# Patient Record
Sex: Female | Born: 1955 | Race: White | Hispanic: No | Marital: Married | Smoking: Current every day smoker
Health system: Southern US, Community
[De-identification: ages and names within clinical notes are randomized; demographics above are authoritative.]

## PROBLEM LIST (undated history)

## (undated) DIAGNOSIS — Z789 Other specified health status: Secondary | ICD-10-CM

## (undated) DIAGNOSIS — M329 Systemic lupus erythematosus, unspecified: Secondary | ICD-10-CM

## (undated) DIAGNOSIS — F419 Anxiety disorder, unspecified: Secondary | ICD-10-CM

## (undated) DIAGNOSIS — F32A Depression, unspecified: Secondary | ICD-10-CM

## (undated) DIAGNOSIS — B1920 Unspecified viral hepatitis C without hepatic coma: Secondary | ICD-10-CM

## (undated) HISTORY — PX: KNEE SURGERY: SHX244

## (undated) HISTORY — DX: Unspecified viral hepatitis C without hepatic coma: B19.20

## (undated) HISTORY — PX: BREAST SURGERY: SHX581

## (undated) HISTORY — DX: Anxiety disorder, unspecified: F41.9

## (undated) HISTORY — DX: Systemic lupus erythematosus, unspecified: M32.9

## (undated) HISTORY — DX: Depression, unspecified: F32.A

## (undated) HISTORY — PX: HAND SURGERY: SHX662

## (undated) HISTORY — DX: Other specified health status: Z78.9

## (undated) HISTORY — PX: DILATION AND CURETTAGE OF UTERUS: SHX78

## (undated) HISTORY — PX: TUBAL LIGATION: SHX77

## (undated) HISTORY — PX: FOOT SURGERY: SHX648

---

## 2010-10-20 ENCOUNTER — Other Ambulatory Visit (HOSPITAL_COMMUNITY): Payer: Self-pay | Admitting: Internal Medicine

## 2010-10-20 DIAGNOSIS — Z139 Encounter for screening, unspecified: Secondary | ICD-10-CM

## 2010-10-27 ENCOUNTER — Inpatient Hospital Stay (HOSPITAL_COMMUNITY): Admission: RE | Admit: 2010-10-27 | Payer: Self-pay | Source: Ambulatory Visit

## 2010-12-09 ENCOUNTER — Other Ambulatory Visit: Payer: Self-pay

## 2010-12-09 ENCOUNTER — Encounter: Payer: Self-pay | Admitting: General Practice

## 2010-12-09 DIAGNOSIS — Z139 Encounter for screening, unspecified: Secondary | ICD-10-CM

## 2010-12-11 ENCOUNTER — Encounter: Payer: Self-pay | Admitting: Gastroenterology

## 2010-12-11 ENCOUNTER — Ambulatory Visit (INDEPENDENT_AMBULATORY_CARE_PROVIDER_SITE_OTHER): Payer: BC Managed Care – PPO | Admitting: Gastroenterology

## 2010-12-11 DIAGNOSIS — Z1211 Encounter for screening for malignant neoplasm of colon: Secondary | ICD-10-CM | POA: Insufficient documentation

## 2010-12-11 DIAGNOSIS — K649 Unspecified hemorrhoids: Secondary | ICD-10-CM | POA: Insufficient documentation

## 2010-12-11 MED ORDER — LIDOCAINE-HYDROCORTISONE ACE 3-1 % RE KIT: 1.0000 "application " | PACK | Freq: Two times a day (BID) | RECTAL | Status: DC

## 2010-12-11 NOTE — Progress Notes (Signed)
Cc to PCP 

## 2010-12-11 NOTE — Assessment & Plan Note (Signed)
Colonoscopy in the near future.  I have discussed the risks, alternatives, benefits with regards to but not limited to the risk of reaction to medication, bleeding, infection, perforation and the patient is agreeable to proceed. Written consent to be obtained.  

## 2010-12-11 NOTE — Patient Instructions (Signed)
I sent RX for hemorrhoids to [facility] in [place].  See separate instructions for your scheduled colonoscopy.

## 2010-12-11 NOTE — Progress Notes (Signed)
Primary Care Physician:  Colette Ribas, MD  Primary Gastroenterologist:  Roetta Sessions, MD   Chief Complaint  Patient presents with  . Colonoscopy    HPI:  Ebony Salinas is a 55 y.o. female here to schedule colonoscopy. She has problems with hemorrhoids as well. intermittent constipation. Worse when she travels. Bowel movement most days. Hemorrhoid issues off/on for years. Some protrusion with itching/burning intermittently. No rectal bleeding or melena. No prior colonoscopy. Has put off having a colonoscopy because her aunt had perforation of the bowel. No heartburn, vomiting, dysphagia, abdominal pain.   Current Outpatient Prescriptions  Medication Sig Dispense Refill  . diazepam (VALIUM) 10 MG tablet Take 10 mg by mouth every 6 (six) hours as needed.        . naproxen sodium (ANAPROX) 220 MG tablet Take 220 mg by mouth 2 (two) times daily with a meal.        . lidocaine-hydrocortisone (ANAMANTLE HC FORTE) 3-1 % KIT Place 1 application rectally 2 (two) times daily.  28 each  0    Allergies as of 12/11/2010  . (No Known Allergies)    Past Medical History  Diagnosis Date  . No pertinent past medical history     Past Surgical History  Procedure Date  . Dilation and curettage of uterus   . Hand surgery     left x2  . Foot surgery     left  . Knee surgery     both  . Tubal ligation   . Breast surgery     x3    Family History  Problem Relation Age of Onset  . Colon cancer Neg Hx   . Colon polyps Neg Hx   . Liver disease Neg Hx     History   Social History  . Marital Status: Married    Spouse Name: N/A    Number of Children: 2  . Years of Education: N/A   Occupational History  .  [employer]   Social History Main Topics  . Smoking status: Current Everyday Smoker -- 0.5 packs/day    Types: Cigarettes  . Smokeless tobacco: Not on file   Comment: 1/2ppd  . Alcohol Use: Yes     4-6 beers a day (12 ounce beers)  . Drug Use: No  . Sexually Active:  Not on file   Other Topics Concern  . Not on file   Social History Narrative  . No narrative on file      ROS:  General: Negative for anorexia, weight loss, fever, chills, fatigue, weakness. Eyes: Negative for vision changes.  ENT: Negative for hoarseness, difficulty swallowing , nasal congestion. CV: Negative for chest pain, angina, palpitations, dyspnea on exertion, peripheral edema.  Respiratory: Negative for dyspnea at rest, dyspnea on exertion, cough, sputum, wheezing.  GI: See history of present illness. GU:  Negative for dysuria, hematuria, urinary incontinence, urinary frequency, nocturnal urination.  MS: Negative for joint pain, low back pain.  Derm: Negative for rash or itching.  Neuro: Negative for weakness, abnormal sensation, seizure, frequent headaches, memory loss, confusion.  Psych: Negative for anxiety, depression, suicidal ideation, hallucinations.  Endo: Negative for unusual weight change.  Heme: Negative for bruising or bleeding. Allergy: Negative for rash or hives.    Physical Examination:  BP 131/86  Pulse 76  Temp(Src) 97.5 F (36.4 C) (Temporal)  Ht 5\' 5"  (1.651 m)  Wt 113 lb 6.4 oz (51.438 kg)  BMI 18.87 kg/m2   General: Well-nourished, well-developed in no  acute distress.  Head: Normocephalic, atraumatic.   Eyes: Conjunctiva pink, no icterus. Mouth: Oropharyngeal mucosa moist and pink , no lesions erythema or exudate. Neck: Supple without thyromegaly, masses, or lymphadenopathy.  Lungs: Clear to auscultation bilaterally.  Heart: Regular rate and rhythm, no murmurs rubs or gallops.  Abdomen: Bowel sounds are normal, nontender, nondistended, no hepatosplenomegaly or masses, no abdominal bruits or    hernia , no rebound or guarding.   Rectal: defer to time of colonoscopy. Extremities: No lower extremity edema. No clubbing or deformities.  Neuro: Alert and oriented x 4 , grossly normal neurologically.  Skin: Warm and dry, no rash or jaundice.     Psych: Alert and cooperative, normal mood and affect.

## 2010-12-11 NOTE — Assessment & Plan Note (Signed)
Anamantle forte apply anorectally bid for two weeks.

## 2010-12-15 ENCOUNTER — Telehealth: Payer: Self-pay

## 2010-12-15 NOTE — Telephone Encounter (Signed)
LMOM that time has changed to 11:00 to be at Short Stay for colonoscopy on 01/02/2011. Asked pt to call back and confirm she received this message.

## 2010-12-16 NOTE — Progress Notes (Signed)
REVIEWED.  

## 2010-12-22 NOTE — Telephone Encounter (Signed)
LMOM again at home of the time change to be at Short Stay at 11:00 AM on 01/02/2011. Mailing a letter also, with the time change.

## 2010-12-25 ENCOUNTER — Encounter (HOSPITAL_COMMUNITY): Payer: Self-pay | Admitting: Pharmacy Technician

## 2011-01-01 MED ORDER — SODIUM CHLORIDE 0.45 % IV SOLN: Freq: Once | INTRAVENOUS | Status: DC

## 2011-01-02 ENCOUNTER — Ambulatory Visit (HOSPITAL_COMMUNITY)
Admission: RE | Admit: 2011-01-02 | Payer: BC Managed Care – PPO | Source: Ambulatory Visit | Admitting: Internal Medicine

## 2011-01-02 ENCOUNTER — Encounter (HOSPITAL_COMMUNITY): Admission: RE | Payer: Self-pay | Source: Ambulatory Visit

## 2011-01-02 SURGERY — COLONOSCOPY
Anesthesia: Moderate Sedation

## 2020-04-02 ENCOUNTER — Encounter: Payer: Self-pay | Admitting: Internal Medicine

## 2020-04-18 ENCOUNTER — Ambulatory Visit (INDEPENDENT_AMBULATORY_CARE_PROVIDER_SITE_OTHER): Payer: BC Managed Care – PPO | Admitting: Nurse Practitioner

## 2020-04-18 ENCOUNTER — Other Ambulatory Visit: Payer: Self-pay

## 2020-04-18 ENCOUNTER — Encounter: Payer: Self-pay | Admitting: Nurse Practitioner

## 2020-04-18 DIAGNOSIS — B1920 Unspecified viral hepatitis C without hepatic coma: Secondary | ICD-10-CM | POA: Insufficient documentation

## 2020-04-18 NOTE — Progress Notes (Signed)
Primary Care Physician:  Sharilyn Sites, MD Primary Gastroenterologist:  Dr. Gala Romney  Chief Complaint  Patient presents with  . Hepatitis C    Newly diagnosed. Was drinking 4 beers per day and now drinks 1 beer per day and few glasses of wine    HPI:   Ebony Salinas is a 65 y.o. female who presents on referral from primary care for newly diagnosed positive hepatitis C.  Reviewed information provided with referral including office visit note dated 03/28/2020.  This is a visit to establish care.  Patient was identified as positive hepatitis C and referred to GI.  Reviewed included labs including protein 13.7/normal, platelets normal at 353, hepatitis C RNA positive, LFTs demonstrate elevated alkaline phosphatase mildly elevated at 126, but bilirubin, AST/ALT normal.  Primary care also indicated that counseled patient on alcohol cessation/avoidance for evaluation of hepatitis C is ongoing.  He also recommended an acute hepatitis panel check for hepatitis A/B serologies.  However, these were not included with the referral information.  Today she states she doing okay overall. She states she's never been told she has Hepatitis C before. Denies abdominal pain, N/V. She did a "poop in the box" think and it came back positive so she needs a colonoscopy. Denies melena, fever, chills. She denies unintentional weight loss. Denies URI or flu-like symptoms. Denies loss of sense of taste or smell. The patient has not received COVID-19 vaccination(s). Denies chest pain, dyspnea, dizziness, lightheadedness, syncope, near syncope. Denies any other upper or lower GI symptoms.  She states she has previously told she has Hepatitis B antibodies.  Hepatitis C Risk Factors: Birth cohort ([place]): Yes IV drug use: Yes, in her 57's Tattoos: Yes (4) at reputable parlor with sterile technique Blood product transfusion: No HC worker: No Hemodialysis: No Maternal infection: No  Past Medical History:   Diagnosis Date  . No pertinent past medical history     Past Surgical History:  Procedure Laterality Date  . BREAST SURGERY     x3  . DILATION AND CURETTAGE OF UTERUS    . FOOT SURGERY     left  . HAND SURGERY     left x2  . KNEE SURGERY     both  . TUBAL LIGATION      Current Outpatient Medications  Medication Sig Dispense Refill  . Cholecalciferol (VITAMIN D) 50 MCG (2000 UT) CAPS Take by mouth. daily    . Glucosamine 750 MG TABS Take by mouth. 2 daily    . hydroxychloroquine (PLAQUENIL) 200 MG tablet Take 200 mg by mouth daily.    . Omega-3 Fatty Acids (OMEGA 3 PO) Take by mouth. 2 daily    . sertraline (ZOLOFT) 25 MG tablet Take 25 mg by mouth daily.    . diazepam (VALIUM) 10 MG tablet Take 10 mg by mouth every 6 (six) hours as needed.   (Patient not taking: Reported on 04/18/2020)    . hydrocodone-acetaminophen (LORCET-HD) 5-500 MG per capsule Take 1 capsule by mouth every 4 (four) hours as needed. Patient broke her arm last Friday and was prescribed these, so she is not taking Valium or Naproxen with this med at this time  (Patient not taking: Reported on 04/18/2020)    . lidocaine-hydrocortisone (ANAMANTLE HC FORTE) 3-1 % KIT Place 1 application rectally 2 (two) times daily. (Patient not taking: Reported on 04/18/2020) 28 each 0  . naproxen sodium (ANAPROX) 220 MG tablet Take 220 mg by mouth 2 (two) times daily  with a meal.   (Patient not taking: Reported on 04/18/2020)     No current facility-administered medications for this visit.    Allergies as of 04/18/2020  . (No Known Allergies)    Family History  Problem Relation Age of Onset  . Colon cancer Neg Hx   . Colon polyps Neg Hx   . Liver disease Neg Hx     Social History   Socioeconomic History  . Marital status: Married    Spouse name: Not on file  . Number of children: 2  . Years of education: Not on file  . Highest education level: Not on file  Occupational History    Employer: [employer]   Tobacco Use  . Smoking status: Current Every Day Smoker    Packs/day: 0.50    Types: Cigarettes  . Smokeless tobacco: Never Used  . Tobacco comment: 1/2ppd  Substance and Sexual Activity  . Alcohol use: Yes  . Drug use: No  . Sexual activity: Not on file  Other Topics Concern  . Not on file  Social History Narrative  . Not on file   Social Determinants of Health   Financial Resource Strain: Not on file  Food Insecurity: Not on file  Transportation Needs: Not on file  Physical Activity: Not on file  Stress: Not on file  Social Connections: Not on file  Intimate Partner Violence: Not on file    Subjective: Review of Systems  Constitutional: Negative for chills, fever, malaise/fatigue and weight loss.  HENT: Negative for congestion and sore throat.   Respiratory: Negative for cough and shortness of breath.   Cardiovascular: Negative for chest pain and palpitations.  Gastrointestinal: Positive for blood in stool. Negative for abdominal pain, diarrhea, heartburn, melena, nausea and vomiting.  Musculoskeletal: Positive for joint pain. Negative for myalgias.  Skin: Negative for rash.  Neurological: Negative for dizziness and weakness.  Endo/Heme/Allergies: Does not bruise/bleed easily.  Psychiatric/Behavioral: Negative for depression. The patient is not nervous/anxious.   All other systems reviewed and are negative.      Objective: BP (!) 136/95   Pulse 77   Temp (!) 96.9 F (36.1 C)   Ht 5' 5"  (1.651 m)   Wt 114 lb 9.6 oz (52 kg)   BMI 19.07 kg/m  Physical Exam Vitals and nursing note reviewed.  Constitutional:      General: She is not in acute distress.    Appearance: Normal appearance. She is well-developed and normal weight. She is not ill-appearing, toxic-appearing or diaphoretic.  HENT:     Head: Normocephalic and atraumatic.     Nose: No congestion or rhinorrhea.  Eyes:     General: No scleral icterus. Cardiovascular:     Rate and Rhythm: Normal rate  and regular rhythm.     Heart sounds: Normal heart sounds.  Pulmonary:     Effort: Pulmonary effort is normal. No respiratory distress.     Breath sounds: Normal breath sounds.  Abdominal:     General: Bowel sounds are normal.     Palpations: Abdomen is soft. There is no hepatomegaly, splenomegaly or mass.     Tenderness: There is no abdominal tenderness. There is no guarding or rebound.     Hernia: No hernia is present.  Skin:    General: Skin is warm and dry.     Coloration: Skin is not jaundiced.     Findings: No rash.  Neurological:     General: No focal deficit present.  Mental Status: She is alert and oriented to person, place, and time.  Psychiatric:        Attention and Perception: Attention normal.        Mood and Affect: Mood normal.        Speech: Speech normal.        Behavior: Behavior normal.        Thought Content: Thought content normal.        Cognition and Memory: Cognition and memory normal.      Assessment:  Very pleasant 65 year old female presents on referral from primary care for positive hepatitis C RNA.  An extensive discussion with education on hepatitis C, hepatitis viruses, high risk behaviors, treatment possibilities with the patient.  High risk behaviors as outlined in HPI including history of IV drug use (none in 4 years), tattoos, birth cohort.  Initial labs including normal LFTs and normal platelet count suggests no significant liver damage.  We will plan for an extensive work-up for hepatitis a and B serologies, HIV, liver parenchymal evaluation with ultrasound elastography.  I will try to get the prior authorization process started for likely Epclusa treatment.  Further recommendations will follow her lab results   Plan: 1. CBC, CMP, hepatitis A antibody, hepatitis B core antigen, hepatitis B surface antigen, hepatitis B core antibody, hepatitis C genotype, HIV antibody with reflex to Western blot 2. Complete abdominal ultrasound with liver  elastography 3. Follow-up in 2 months for further hepatitis C treatment decisions and to schedule colonoscopy    Thank you for allowing [place] to participate in the care of McGregor, DNP, AGNP-C Adult & Gerontological Nurse Practitioner [facility]   04/18/2020 8:26 AM   Disclaimer: This note was dictated with voice recognition software. Similar sounding words can inadvertently be transcribed and may not be corrected upon review.

## 2020-04-18 NOTE — Patient Instructions (Signed)
Your health issues we discussed today were:   Hepatitis C: 1. Have your labs completed when you are able to 2. We will call you with results 3. We will help schedule your ultrasound your liver for you 4. Further recommendations will follow all your results 5. We can further discuss treatment options at your follow-up visit 6. Call us if you have any questions about hepatitis C  Overall I recommend:  1. Continue other current medications 2. Return for follow-up in 2 months related to hepatitis C and to schedule a colonoscopy for you 3. Call us if you have any questions or concerns   ---------------------------------------------------------------  At [facility] we value your feedback. You may receive a survey about your visit today. Please share your experience as we strive to create trusting relationships with our patients to provide genuine, compassionate, quality care.  We appreciate your understanding and patience as we review any laboratory studies, imaging, and other diagnostic tests that are ordered as we care for you. Our office policy is 5 business days for review of these results, and any emergent or urgent results are addressed in a timely manner for your best interest. If you do not hear from our office in 1 week, please contact us.   We also encourage the use of MyChart, which contains your medical information for your review as well. If you are not enrolled in this feature, an access code is on this after visit summary for your convenience. Thank you for allowing [place] to be involved in your care.  It was great to see you today!  I hope you have a great spring!!

## 2020-04-19 ENCOUNTER — Other Ambulatory Visit (HOSPITAL_COMMUNITY)
Admission: RE | Admit: 2020-04-19 | Discharge: 2020-04-19 | Disposition: A | Discharge status: Home / Self Care | Payer: BC Managed Care – PPO | Source: Ambulatory Visit | Attending: Nurse Practitioner | Admitting: Nurse Practitioner

## 2020-04-19 DIAGNOSIS — B1920 Unspecified viral hepatitis C without hepatic coma: Secondary | ICD-10-CM | POA: Diagnosis present

## 2020-04-19 LAB — CBC WITH DIFFERENTIAL/PLATELET
Abs Immature Granulocytes: 0.02 10*3/uL (ref 0.00–0.07)
Basophils Absolute: 0 10*3/uL (ref 0.0–0.1)
Basophils Relative: 1 %
Eosinophils Absolute: 0.1 10*3/uL (ref 0.0–0.5)
Eosinophils Relative: 1 %
HCT: 41.4 % (ref 36.0–46.0)
Hemoglobin: 13.7 g/dL (ref 12.0–15.0)
Immature Granulocytes: 0 %
Lymphocytes Relative: 30 %
Lymphs Abs: 2.3 10*3/uL (ref 0.7–4.0)
MCH: 33.1 pg (ref 26.0–34.0)
MCHC: 33.1 g/dL (ref 30.0–36.0)
MCV: 100 fL (ref 80.0–100.0)
Monocytes Absolute: 0.6 10*3/uL (ref 0.1–1.0)
Monocytes Relative: 8 %
Neutro Abs: 4.5 10*3/uL (ref 1.7–7.7)
Neutrophils Relative %: 60 %
Platelets: 65 10*3/uL — ABNORMAL LOW (ref 150–400)
RBC: 4.14 MIL/uL (ref 3.87–5.11)
RDW: 12.6 % (ref 11.5–15.5)
WBC: 7.5 10*3/uL (ref 4.0–10.5)
nRBC: 0 % (ref 0.0–0.2)

## 2020-04-19 LAB — HEPATIC FUNCTION PANEL
ALT: 20 U/L (ref 0–44)
AST: 29 U/L (ref 15–41)
Albumin: 4.4 g/dL (ref 3.5–5.0)
Alkaline Phosphatase: 108 U/L (ref 38–126)
Bilirubin, Direct: 0.1 mg/dL (ref 0.0–0.2)
Indirect Bilirubin: 0.5 mg/dL (ref 0.3–0.9)
Total Bilirubin: 0.6 mg/dL (ref 0.3–1.2)
Total Protein: 8.2 g/dL — ABNORMAL HIGH (ref 6.5–8.1)

## 2020-04-19 LAB — HIV ANTIBODY (ROUTINE TESTING W REFLEX): HIV Screen 4th Generation wRfx: NONREACTIVE

## 2020-04-19 LAB — HEPATITIS B CORE ANTIBODY, TOTAL: Hep B Core Total Ab: NONREACTIVE

## 2020-04-19 LAB — HEPATITIS B SURFACE ANTIGEN: Hepatitis B Surface Ag: NONREACTIVE

## 2020-04-19 LAB — HEPATITIS B SURFACE ANTIBODY,QUALITATIVE: Hep B S Ab: NONREACTIVE

## 2020-04-19 LAB — HEPATITIS A ANTIBODY, TOTAL: hep A Total Ab: NONREACTIVE

## 2020-04-23 LAB — HEPATITIS C GENOTYPE

## 2020-04-25 ENCOUNTER — Telehealth: Payer: Self-pay | Admitting: *Deleted

## 2020-04-25 NOTE — Telephone Encounter (Signed)
Order in [place] for [place] but we were not given encounter form to schedule.  [place] scheduled for 4/26 at 10:30am, arrival 10:15am, npo midnight  Called pt and she is aware of appt details. She voiced understanding and needed nothing further

## 2020-04-30 ENCOUNTER — Other Ambulatory Visit: Payer: Self-pay

## 2020-04-30 ENCOUNTER — Ambulatory Visit (HOSPITAL_COMMUNITY)
Admission: RE | Admit: 2020-04-30 | Discharge: 2020-04-30 | Disposition: A | Discharge status: Home / Self Care | Payer: BC Managed Care – PPO | Source: Ambulatory Visit | Attending: Nurse Practitioner | Admitting: Nurse Practitioner

## 2020-04-30 DIAGNOSIS — B1920 Unspecified viral hepatitis C without hepatic coma: Secondary | ICD-10-CM | POA: Diagnosis present

## 2020-05-07 ENCOUNTER — Encounter: Payer: Self-pay | Admitting: Internal Medicine

## 2020-05-08 ENCOUNTER — Telehealth: Payer: Self-pay | Admitting: Internal Medicine

## 2020-05-08 NOTE — Telephone Encounter (Signed)
See result note.  

## 2020-05-08 NOTE — Telephone Encounter (Signed)
Returning call.

## 2020-05-09 ENCOUNTER — Telehealth: Payer: Self-pay | Admitting: Nurse Practitioner

## 2020-05-09 DIAGNOSIS — B1920 Unspecified viral hepatitis C without hepatic coma: Secondary | ICD-10-CM

## 2020-05-09 NOTE — Telephone Encounter (Signed)
Please tell the patient one of the hepatitis C labs that was drawn only told us "yes/no" if hepatitis C is present.  We need the number of viral copies.  Please have her blood test drawn when she is able to empty completely final paperwork to submit to insurance for coverage of hepatitis C medication.

## 2020-05-09 NOTE — Telephone Encounter (Signed)
Noted. Called and informed pt.

## 2020-05-10 ENCOUNTER — Other Ambulatory Visit (HOSPITAL_COMMUNITY)
Admission: RE | Admit: 2020-05-10 | Discharge: 2020-05-10 | Disposition: A | Discharge status: Home / Self Care | Payer: BC Managed Care – PPO | Source: Ambulatory Visit | Attending: Nurse Practitioner | Admitting: Nurse Practitioner

## 2020-05-10 ENCOUNTER — Other Ambulatory Visit: Payer: Self-pay

## 2020-05-10 DIAGNOSIS — B1920 Unspecified viral hepatitis C without hepatic coma: Secondary | ICD-10-CM | POA: Insufficient documentation

## 2020-05-15 LAB — HCV RNA QUANT RFLX ULTRA OR GENOTYP
HCV RNA Qnt(log copy/mL): 6.114 log10 IU/mL
HepC Qn: 1300000 IU/mL

## 2020-05-15 LAB — HEPATITIS C GENOTYPE

## 2020-07-01 ENCOUNTER — Ambulatory Visit: Payer: BC Managed Care – PPO | Admitting: Gastroenterology

## 2020-08-18 NOTE — Progress Notes (Signed)
Referring Provider: Assunta Found, MD Primary Care Physician:  Donetta Potts, MD Primary GI Physician: Dr. Jena Gauss  Chief Complaint  Patient presents with   Hepatitis C    Here to discuss starting tx    HPI:   Ebony Salinas is a 65 y.o. female presenting today for follow-up on hepatitis C.  Last seen in our office 04/18/20 at the time of initial consult. She had no significant upper or lower GI symptoms.  Reported completing a stool test that came back positive with need for colonoscopy.  Reported history of IV drug use(none in 4 years).  Also with tattoos but had a reputable parlor.  She was working on reducing alcohol intake, down to 1 beer daily and 2 to 3 glasses of wine daily.  Recent labs with PCP with normal LFTs and platelets.  Plan to update labs, evaluate for hepatitis a and B as well as hepatitis C genotype and HIV, ultrasound with elastography.  Follow-up in 2 months.  Hepatitis A antibody nonreactive, hepatitis B surface antibody, core antibody, surface antigen nonreactive, HIV nonreactive, HCVRNA 1,300,000, Hep C genotype 2B.  LFTs within normal limits.  CBC with platelets 65 (L).  Due to ongoing alcohol use, recommended patient will reports complete alcohol cessation over the next 6-8 weeks, then follow-up to discuss treatment.  Abdominal ultrasound with elastography 04/30/2020: Normal-appearing liver, normal spleen, median kPa 3.2, high probability of being.  In the interim, patient had colonoscopy 06/13/20 by Dr. Marcha Solders with eight small polyps in the rectum, removed with a hot snare. Resected and retrieved. Pathology with hyperplastic polyps.   Today: States she is doing very well.  She denies any alcohol since 08/06/2018.  States she used to drink a couple of beers or a couple of mixed drinks a day.  Stopping alcohol is not a problem for her and she feels she will be able to continue this throughout hepatitis C treatment.  Denies abdominal pain, nausea, vomiting, reflux  symptoms, dysphagia, constipation, diarrhea, BRBPR, melena.  Prefers Mavyret over [place] due to 8 week course.   Past Medical History:  Diagnosis Date   Anxiety and depression    Hepatitis C    Genotype 2B   Lupus (HCC)    No pertinent past medical history     Past Surgical History:  Procedure Laterality Date   BREAST SURGERY     x3   DILATION AND CURETTAGE OF UTERUS     FOOT SURGERY     left   HAND SURGERY     left x2   KNEE SURGERY     both   TUBAL LIGATION      Current Outpatient Medications  Medication Sig Dispense Refill   calcium gluconate 500 MG tablet Take 2 tablets by mouth daily.     Cholecalciferol (VITAMIN D) 50 MCG (2000 UT) CAPS Take by mouth. daily     Glucosamine 750 MG TABS Take by mouth. 2 daily     hydroxychloroquine (PLAQUENIL) 200 MG tablet Take 200 mg by mouth daily.     Omega-3 Fatty Acids (OMEGA 3 PO) Take by mouth. 2 daily     sertraline (ZOLOFT) 50 MG tablet Take 50 mg by mouth daily.     No current facility-administered medications for this visit.    Allergies as of 08/19/2020   (No Known Allergies)    Family History  Problem Relation Age of Onset   Colon cancer Neg Hx    Colon polyps Neg Hx  Liver disease Neg Hx     Social History   Socioeconomic History   Marital status: Married    Spouse name: Not on file   Number of children: 2   Years of education: Not on file   Highest education level: Not on file  Occupational History    Employer: [employer]  Tobacco Use   Smoking status: Every Day    Packs/day: 0.50    Types: Cigarettes   Smokeless tobacco: Never   Tobacco comments:    1/2ppd  Substance and Sexual Activity   Alcohol use: Not Currently    Comment: 1 beer and 2-3 glasses of wine a day. stopped as of 08/05/20   Drug use: Not Currently    Comment: In her 46s   Sexual activity: Not on file  Other Topics Concern   Not on file  Social History Narrative   Not on file   Social Determinants of Health    Financial Resource Strain: Not on file  Food Insecurity: Not on file  Transportation Needs: Not on file  Physical Activity: Not on file  Stress: Not on file  Social Connections: Not on file    Review of Systems: Gen: Denies fever, chills, cold or flulike symptoms, presyncope, syncope. CV: Denies chest pain, palpitations, Resp: Denies dyspnea or cough. GI: See HPI Heme:See HPI  Physical Exam: BP 140/86   Pulse 68   Temp 97.8 F (36.6 C)   Ht 5\' 5"  (1.651 m)   Wt 108 lb 12.8 oz (49.4 kg)   BMI 18.11 kg/m  General:   Alert and oriented. No distress noted. Pleasant and cooperative.  Head:  Normocephalic and atraumatic. Eyes:  Conjuctiva clear without scleral icterus. Heart:  S1, S2 present without murmurs appreciated. Lungs:  Clear to auscultation bilaterally. No wheezes, rales, or rhonchi. No distress.  Abdomen:  +BS, soft, non-tender and non-distended. No rebound or guarding. No HSM or masses noted. Msk:  Symmetrical without gross deformities. Normal posture. Extremities:  Without edema. Neurologic:  Alert and  oriented x4 Psych:  Normal mood and affect.    Assessment: 65 year old female presenting today to discuss possibly starting treatment for hepatitis C.  In April, HCVRNA 1,300,000, genotype 2B.  LFTs within normal limits.  CBC with platelets 65 (L).  Abdominal ultrasound with elastography 04/30/2020 with normal-appearing liver, normal spleen, median kPa 3.2, high probability of being.  Since that time, she has been working towards alcohol cessation and reports abstinent since 08/05/2020.  She feels this was fairly easy for her and will be able to keep this up through treatment without problem.  Risk factors for hep C exposure include IV drug use years ago and history of tattoos, but a reputable parlor.  We will plan to get a baseline kidney function and then submit for hepatitis C treatment.  Patient prefers Mavyret over [place] due to 8-week course would be reasonable as  long as insurance will [place].  Thrombocytopenia: Likely secondary to chronic alcohol use/Hep C infection.  No signs of cirrhosis/splenomegaly on imaging to account for thrombocytopenia.   Plan:  1.  BMP. 2.  Follow BMP results, we will submit for Mavyret. 3.  Counseled importance of complete alcohol cessation during hep C treatment and patient feels she will have no trouble being compliant with this. 4.  Discussed the need for close follow-up and monitoring of labs while being treated for hepatitis C.  Patient voiced understanding and willingness to be compliant with this.  - CBC,  CMP, and HCV RNA 4 weeks after starting Hep C treatment.   - CBC, CMP, HCV RNA at completion of Hep C treatment.   - HCV RNA 12 weeks after Hep C treatment.  5.  Discussed the importance of preventing others recommended to contact with her blood, not sharing personal equipment with others such as toothbrushes and dental/shaving equipment.  6.  Hep A/B vaccination to be completed after Hep C treatment.     Ermalinda Memos, PA-C [facility]

## 2020-08-19 ENCOUNTER — Other Ambulatory Visit: Payer: Self-pay

## 2020-08-19 ENCOUNTER — Ambulatory Visit (INDEPENDENT_AMBULATORY_CARE_PROVIDER_SITE_OTHER): Payer: BC Managed Care – PPO | Admitting: Gastroenterology

## 2020-08-19 ENCOUNTER — Encounter: Payer: Self-pay | Admitting: Gastroenterology

## 2020-08-19 ENCOUNTER — Other Ambulatory Visit (HOSPITAL_COMMUNITY)
Admission: RE | Admit: 2020-08-19 | Discharge: 2020-08-19 | Disposition: A | Discharge status: Home / Self Care | Payer: BC Managed Care – PPO | Source: Ambulatory Visit | Attending: Gastroenterology | Admitting: Gastroenterology

## 2020-08-19 VITALS — BP 140/86 | HR 68 | Temp 97.8°F | Ht 65.0 in | Wt 108.8 lb

## 2020-08-19 DIAGNOSIS — B1920 Unspecified viral hepatitis C without hepatic coma: Secondary | ICD-10-CM | POA: Diagnosis not present

## 2020-08-19 LAB — BASIC METABOLIC PANEL
Anion gap: 5 (ref 5–15)
BUN: 15 mg/dL (ref 8–23)
CO2: 29 mmol/L (ref 22–32)
Calcium: 9.6 mg/dL (ref 8.9–10.3)
Chloride: 102 mmol/L (ref 98–111)
Creatinine, Ser: 0.91 mg/dL (ref 0.44–1.00)
GFR, Estimated: >60 mL/min (ref >60)
Glucose, Bld: 64 mg/dL — ABNORMAL LOW (ref 70–99)
Potassium: 4.2 mmol/L (ref 3.5–5.1)
Sodium: 136 mmol/L (ref 135–145)

## 2020-08-19 NOTE — Patient Instructions (Signed)
Please have blood work completed at [facility].   Once blood work has been completed and I have reviewed, we will work on getting you started on a medication to treat your Hepatitis. C. This will either Epclusa or Mavyret. I will look into this further for you.    Ermalinda Memos, PA-C [facility]

## 2020-08-26 ENCOUNTER — Telehealth: Payer: Self-pay

## 2020-08-26 NOTE — Telephone Encounter (Signed)
On 08/22/2020 faxed to [facility] the pt's referral, Rx, ov notes labs, and insurance for the pt to receive her Hep C medication.    Got a denial from [facility] today that the pt's Mavyret is not covered by the [employer] as the preferred agent is [place]. ([facility] does their own PA's).  Will give to [facility] to look over and decide what is next.

## 2020-08-30 NOTE — Telephone Encounter (Signed)
Epclusa paperwork signed and returned to [facility].   [name], please keep me updated on when Ebony Salinas is approved so I can provide appropriate instructions for administration.

## 2020-08-30 NOTE — Telephone Encounter (Signed)
I have re-done the patients paperwork for Epclusa for you to sign. It's on your desk

## 2020-08-30 NOTE — Telephone Encounter (Signed)
Noted Paperwork faxed to [facility]

## 2020-09-02 ENCOUNTER — Telehealth: Payer: Self-pay | Admitting: Internal Medicine

## 2020-09-02 NOTE — Telephone Encounter (Signed)
Spoke with Lewie Loron, NP and was advised it is ok to wait to send in the new Rx for Epclusa for the pt to [facility] when Ermalinda Memos comes back from vacation. This will be sent to [phone] (fax #), [phone] (phone #) or send electronically.

## 2020-09-02 NOTE — Telephone Encounter (Signed)
Pt has questions about a medication. Please call [phone]

## 2020-09-02 NOTE — Telephone Encounter (Signed)
Returned the pt's call and LMOVM to call

## 2020-09-02 NOTE — Telephone Encounter (Signed)
Pt's Ebony Salinas has been approved. The approval dates are 08/30/2020 to 11/22/2020.  Baxter Hire you had wanted me to let you know when this was approved. So I will be sending this to Tobi Bastos as well and I know you wanted to approve/provide instructions for the pt. So my question is should I wait for Baxter Hire to return or Lewie Loron to give the instructions regarding the Epclusa. Please advise.  Sent to Lewie Loron also Ermalinda Memos who is on vacation 8/29 through 9/5.

## 2020-09-05 NOTE — Telephone Encounter (Signed)
[facility] phoned (Ebony Salinas) advising new Rx needs to be sent in and I advised her that the Dr will be back in on Tuesday so she can make notes on her end.

## 2020-09-11 ENCOUNTER — Telehealth: Payer: Self-pay | Admitting: Internal Medicine

## 2020-09-11 NOTE — Telephone Encounter (Signed)
Rx clarification request signed by Ermalinda Memos, PA-C and [facility] and received confirmation.

## 2020-09-11 NOTE — Telephone Encounter (Signed)
See other note

## 2020-09-11 NOTE — Telephone Encounter (Signed)
Phoned and advised the pt of Ebony Salinas just coming back from vacation and we sent paper work today. Also once all that is done we will get directions of medication use and medication (when it arrives) and call her here to the office. I advised this can be a process but once we get everything we need we will call her into the office to sign for her medication. She expressed understanding.

## 2020-09-11 NOTE — Telephone Encounter (Signed)
Patient called asking when she was supposed to start taking Apclusa (?)

## 2020-09-13 NOTE — Telephone Encounter (Signed)
Spoke with [employer] @ [facility] stated they needed clarification from [place] that the pt hasn't started the medication. I was transferred to [facility] (one of the pharmacist) and advised her that the the Rx was right for Epclusa 28 day supply with 2 refills. (Ph # [phone], fax # [phone])   Baxter Hire I will need to at some point next week get the instructions from you to type up for this pt.  Have a great weekend.

## 2020-10-04 ENCOUNTER — Telehealth: Payer: Self-pay | Admitting: Gastroenterology

## 2020-10-04 NOTE — Telephone Encounter (Signed)
Ebony Salinas, where are we at with getting Epclusa for this patient?   I see a telephone note on 9/7 that wasn't routed to me.

## 2020-10-07 NOTE — Telephone Encounter (Signed)
Phoned to [facility] spoke with Lauris Poag and was advised that they had tried to contact the office several times and I advised her that I had not received a call. So she went into the file and saw where the pts insurance wanted the brand name Epclusa (because Rx was originally sent in for the generic). I advised her that it had been approved for Epclusa from 08/2020 to 11/2020. So this was just put on file from her. We had sent another Rx on 08/30/2020 for the Epclusa. So this had been approved but [employer] failed to put it in the system on their end. Lauris Poag was going to send everything back to that dept and if they have any questions they will call me and be transferred to my extension to speak directly to me. I had also phoned the pt before placing this call to see if she had started the medication and she said no. I asked her why didn't she call us to make [place] aware and she said she just thought we knew. I still have all the paperwork because I couldn't turn it in because I was waiting on the medication so the instructions can be given to the pt in a letter  form. We can go ahead and do the instructions for the Epclusa.

## 2020-10-07 NOTE — Telephone Encounter (Signed)
Are we expecting the medication to be mailed to our office soon? I will provide instructions once we have the medication.   If you haven't heard anything by the end of the week, please reach back out to follow-up.

## 2020-10-07 NOTE — Telephone Encounter (Signed)
According to [facility] we should receive some correspondence from them today or tomorrow. Noted will follow-up if anything hasn't been received or call made to [place]. I'm leaving this note in my box as a reminder.

## 2020-10-07 NOTE — Telephone Encounter (Signed)
Note Phoned to [facility] spoke with Lauris Poag and was advised that they had tried to contact the office several times and I advised her that I had not received a call. So she went into the file and saw where the pts insurance wanted the brand name Epclusa (because Rx was originally sent in for the generic). I advised her that it had been approved for Epclusa from 08/2020 to 11/2020. So this was just put on file from her. We had sent another Rx on 08/30/2020 for the Epclusa. So this had been approved but [employer] failed to put it in the system on their end. Lauris Poag was going to send everything back to that dept and if they have any questions they will call me and be transferred to my extension to speak directly to me. I had also phoned the pt before placing this call to see if she had started the medication and she said no. I asked her why didn't she call us to make [place] aware and she said she just thought we knew. I still have all the paperwork because I couldn't turn it in because I was waiting on the medication so the instructions can be given to the pt in a letter  form. We can go ahead and do the instructions for the Epclusa.

## 2020-10-10 NOTE — Telephone Encounter (Signed)
Ebony Salinas, I phoned to [facility] and spoke with Dewitt Hoes [phone]) inquiring of the pt's medication. I was advised by her that the pt will not return their calls to be  put on the schedule for delivery (even though it is coming here). She name several call but they have been waiting on her to contact them. I phoned the pt and she stated she hasn't received any calls from them, only emails in August. I gave the pt the phone number and the lady's name I spoke with and advised her to call her as soon as I hang up because this is why we haven't received her medication. Pt was reminded that we do not have deliveries on Friday's because it is half days for [place]. So hopefully pt will comply and this can be settled. FYI:

## 2020-10-10 NOTE — Telephone Encounter (Signed)
Communication noted.  

## 2020-10-14 NOTE — Telephone Encounter (Signed)
Instructions put to a letterhead. Will do lab forms for this pt tomorrow and put to the side to send out to have done in 4 weeks

## 2020-10-14 NOTE — Telephone Encounter (Signed)
This is great news!   Epclusa for Hepatitis C Instructions:   Take Epclusa, one tablet daily with or without food. You should take it at approximately the same time every day.  Do not miss a dose. You will start on __________ and end on __________ (12 weeks of treatment).   Do not run out of Epclusa! If you are down to one week of medication left and have not heard about your next shipment, please let us know as soon as possible.   If you need to start a new medication, prescription from your doctor or over the counter medication, you need to contact us to make sure it does not interfere with Epclusa. There are several medications that can interfere with Epclusa and can make you sick or make the medication not work.  Tylenol (acetominophen) and Advil (ibuprofen) are safe to take with Epclusa if needed for headache, fever, pain.   Continue to avoid all alcohol while taking Epclusa.   You will have blood work done after 4 weeks of treatment to make sure you are tolerating Epclusa and to see if the medication is getting rid of the Hepatitis C. Ideally, you would have an OV in 4 weeks as well, but we do not have anything available at this time. I will reach out to you by phone to ensure you are doing well once your labs are completed.   If you have any questions or concerns about Epclusa while taking the medication, do not hesitate to call us.   DO NOT stop Epclusa unless instructed to by your provider.  You will also have blood work done at the end of treatment and 12-24 weeks after end of treatment.    [name]: Please arrange CMP, HCV RNA Quantitative to be completed 4 weeks after she starts [place].   Lets plan to follow-up in the office in 3 months.   Stacey, please arrange OV follow-up in 3 months. Dx: Hep C.

## 2020-10-14 NOTE — Telephone Encounter (Signed)
FYI: Phoned back to [facility] and the pt's medication is due to arrive here Wednesday the 12th. So whenever you can get the instructions to me for the medication we will be all set for the pt when she arrives to get her medication after I call her.

## 2020-10-15 ENCOUNTER — Other Ambulatory Visit: Payer: Self-pay

## 2020-10-15 ENCOUNTER — Encounter: Payer: Self-pay | Admitting: Internal Medicine

## 2020-10-15 DIAGNOSIS — B1920 Unspecified viral hepatitis C without hepatic coma: Secondary | ICD-10-CM

## 2020-10-15 NOTE — Telephone Encounter (Signed)
FYI: Labs have been printed along with the instructions of when and where to go to have done. Just waiting for the medication to arrive and from there have the pt to come to the office to sign packing slip and all paperwork goes to scan. (Except pt's instruction sheets and labs they will be given to the pt.)

## 2020-10-16 ENCOUNTER — Telehealth: Payer: Self-pay | Admitting: Internal Medicine

## 2020-10-16 NOTE — Telephone Encounter (Signed)
Returned pt's call and she will be here sometime tomorrow to sign and pick up medication

## 2020-10-16 NOTE — Telephone Encounter (Signed)
See other note

## 2020-10-16 NOTE — Telephone Encounter (Signed)
Pt returning call. [phone]

## 2020-10-16 NOTE — Telephone Encounter (Signed)
Phoned and LMOVM for the pt to return call 

## 2020-10-18 NOTE — Telephone Encounter (Signed)
Phoned the pt and sent to her vm, so I LMOVM for her to return call

## 2020-10-18 NOTE — Telephone Encounter (Signed)
FYI: Pt returned call and she stated she called here the other day and LMOVM upfront stating she was sick and battling flu like symptoms (taking OTC meds). She advises that Monday will be a better day for her to come. I advised her to call us first to make sure she is all better than rather "no show". Pt agreed to call us Monday.

## 2020-10-18 NOTE — Telephone Encounter (Signed)
Communication noted.  

## 2020-10-21 NOTE — Telephone Encounter (Signed)
FYI: Pt returned the call, she is still sick (and it sounds like it). She states that she will call me back Wednesday because she really doesn't want to start on a new medication right now being sick.

## 2020-10-21 NOTE — Telephone Encounter (Signed)
Noted. Agree with waiting until she is feeling better.

## 2020-10-21 NOTE — Telephone Encounter (Signed)
noted 

## 2020-10-21 NOTE — Telephone Encounter (Signed)
Returned the pt's call from my vm, LMOVM for the pt to return call.

## 2020-10-23 ENCOUNTER — Other Ambulatory Visit: Payer: Self-pay

## 2020-10-23 ENCOUNTER — Telehealth: Payer: Self-pay | Admitting: Internal Medicine

## 2020-10-23 DIAGNOSIS — B1920 Unspecified viral hepatitis C without hepatic coma: Secondary | ICD-10-CM

## 2020-10-23 NOTE — Telephone Encounter (Signed)
Noted  

## 2020-10-23 NOTE — Telephone Encounter (Signed)
Pt asked for [name] to call her back at [phone] regarding a medication

## 2020-10-23 NOTE — Telephone Encounter (Signed)
FYI: returned the pt's call and she advised me that she has just starting to cough up phlegm today. She advises she will be in Monday for her medication. (Pt stated if she would have known her illness was going to drag on like this she would have went to her dr.)

## 2020-10-23 NOTE — Telephone Encounter (Signed)
Bloodwork done again (all at [facility]). Another letter typed (without date to get labs done. Will wait to write in when the pt comes). FYI

## 2020-10-23 NOTE — Telephone Encounter (Signed)
See other phone note

## 2020-10-28 NOTE — Telephone Encounter (Signed)
Fantastic!   Can you place a reminder in Epic for yourself to call patient in 4 weeks to remind her to have labs completed if they have not been by that time?

## 2020-10-28 NOTE — Telephone Encounter (Signed)
FYI: Great News!!! Pt just came and signed and picked up her medication. I am sending all paperwork to scan.

## 2020-10-29 NOTE — Telephone Encounter (Signed)
Noted  Already done

## 2020-11-12 ENCOUNTER — Telehealth: Payer: Self-pay | Admitting: *Deleted

## 2020-11-12 NOTE — Telephone Encounter (Signed)
Spoke to Morton from [employer]. She stated that Ebony Salinas will be delivered to the office on Monday, Nov 14th.

## 2020-11-13 NOTE — Telephone Encounter (Signed)
Pt. States she has enough for 2 weeks.

## 2020-11-13 NOTE — Telephone Encounter (Signed)
Noted  

## 2020-11-13 NOTE — Telephone Encounter (Signed)
Spoke to pt. Informed her that medications will be here on Monday Nov 14th. She stated she would pick them up the week she has her blood work done.

## 2020-11-13 NOTE — Telephone Encounter (Signed)
Noted. Please make sure patient is aware and remind her to have labs completed week of 11/21.

## 2020-11-13 NOTE — Telephone Encounter (Signed)
That is fine. However, she must be sure that she picks up the medication in time so she doesn't run out of [place].

## 2020-11-18 ENCOUNTER — Telehealth: Payer: Self-pay | Admitting: *Deleted

## 2020-11-18 NOTE — Telephone Encounter (Signed)
Spoke to pt. Informed her that Dorita Fray was here to be picked up. She informed me that she would get it next week due to transportation issues.

## 2020-11-18 NOTE — Telephone Encounter (Signed)
LMOM for pt to call office back 

## 2020-11-18 NOTE — Telephone Encounter (Signed)
Noted. Per documentation on 11/9, patient stated she had 2 weeks of Eplusa, so she should be fine to wait until next week, just remind her it is important not to run out. Also remind her to complete her blood work.

## 2020-11-19 NOTE — Telephone Encounter (Signed)
Noted  

## 2020-11-19 NOTE — Telephone Encounter (Signed)
Spoke to pt. Informed her not to let medication run out and to have blood work done. Pt. Stated she would have labs done next week also.

## 2020-11-25 ENCOUNTER — Telehealth: Payer: Self-pay | Admitting: *Deleted

## 2020-11-25 ENCOUNTER — Other Ambulatory Visit (HOSPITAL_COMMUNITY)
Admission: RE | Admit: 2020-11-25 | Discharge: 2020-11-25 | Disposition: A | Discharge status: Home / Self Care | Payer: BC Managed Care – PPO | Source: Ambulatory Visit | Attending: Gastroenterology | Admitting: Gastroenterology

## 2020-11-25 DIAGNOSIS — B1920 Unspecified viral hepatitis C without hepatic coma: Secondary | ICD-10-CM | POA: Insufficient documentation

## 2020-11-25 LAB — COMPREHENSIVE METABOLIC PANEL
ALT: 14 U/L (ref 0–44)
AST: 24 U/L (ref 15–41)
Albumin: 4.4 g/dL (ref 3.5–5.0)
Alkaline Phosphatase: 82 U/L (ref 38–126)
Anion gap: 5 (ref 5–15)
BUN: 15 mg/dL (ref 8–23)
CO2: 30 mmol/L (ref 22–32)
Calcium: 9.7 mg/dL (ref 8.9–10.3)
Chloride: 103 mmol/L (ref 98–111)
Creatinine, Ser: 0.72 mg/dL (ref 0.44–1.00)
GFR, Estimated: >60 mL/min (ref >60)
Glucose, Bld: 72 mg/dL (ref 70–99)
Potassium: 4.3 mmol/L (ref 3.5–5.1)
Sodium: 138 mmol/L (ref 135–145)
Total Bilirubin: 0.6 mg/dL (ref 0.3–1.2)
Total Protein: 7.8 g/dL (ref 6.5–8.1)

## 2020-11-25 LAB — CBC WITH DIFFERENTIAL/PLATELET
Abs Immature Granulocytes: 0.02 10*3/uL (ref 0.00–0.07)
Basophils Absolute: 0.1 10*3/uL (ref 0.0–0.1)
Basophils Relative: 1 %
Eosinophils Absolute: 0 10*3/uL (ref 0.0–0.5)
Eosinophils Relative: 0 %
HCT: 41.3 % (ref 36.0–46.0)
Hemoglobin: 13.9 g/dL (ref 12.0–15.0)
Immature Granulocytes: 0 %
Lymphocytes Relative: 28 %
Lymphs Abs: 2.2 10*3/uL (ref 0.7–4.0)
MCH: 31.9 pg (ref 26.0–34.0)
MCHC: 33.7 g/dL (ref 30.0–36.0)
MCV: 94.7 fL (ref 80.0–100.0)
Monocytes Absolute: 0.6 10*3/uL (ref 0.1–1.0)
Monocytes Relative: 8 %
Neutro Abs: 4.9 10*3/uL (ref 1.7–7.7)
Neutrophils Relative %: 63 %
Platelets: 293 10*3/uL (ref 150–400)
RBC: 4.36 MIL/uL (ref 3.87–5.11)
RDW: 12.6 % (ref 11.5–15.5)
WBC: 7.8 10*3/uL (ref 4.0–10.5)
nRBC: 0 % (ref 0.0–0.2)

## 2020-11-25 NOTE — Telephone Encounter (Signed)
Pt came in office to pick up Epclusa. Informed her to go have labs done. Pt voiced understanding.

## 2020-11-26 LAB — HCV RNA QUANT: HCV Quantitative: NOT DETECTED IU/mL (ref >50)

## 2020-11-27 ENCOUNTER — Telehealth: Payer: Self-pay | Admitting: Internal Medicine

## 2020-11-27 ENCOUNTER — Other Ambulatory Visit: Payer: Self-pay | Admitting: *Deleted

## 2020-11-27 DIAGNOSIS — B1920 Unspecified viral hepatitis C without hepatic coma: Secondary | ICD-10-CM

## 2020-11-27 NOTE — Telephone Encounter (Signed)
See lab result note. Courtney relayed results and recommendations to patient.

## 2020-11-27 NOTE — Progress Notes (Signed)
Note.  Thanks !

## 2020-11-27 NOTE — Telephone Encounter (Signed)
Patient states she is returning your call, please call her back

## 2020-12-06 ENCOUNTER — Telehealth: Payer: Self-pay | Admitting: *Deleted

## 2020-12-06 NOTE — Telephone Encounter (Signed)
Prior Authorization left on providers desk. Needs another approval, because the last one expired 11/22/20.

## 2020-12-09 NOTE — Telephone Encounter (Signed)
Noted. Returned to [place] to fill out PA paperwork.

## 2020-12-10 NOTE — Telephone Encounter (Signed)
Filled out PA, returned to provider to sign

## 2020-12-11 NOTE — Telephone Encounter (Signed)
Forms updated and signed. Returned to [employer].

## 2020-12-11 NOTE — Telephone Encounter (Signed)
Forms faxed to [facility]

## 2020-12-11 NOTE — Telephone Encounter (Signed)
Noted  

## 2020-12-11 NOTE — Telephone Encounter (Signed)
Returned to [place] with a few corrections needed. Will sing once form is updated.

## 2020-12-12 ENCOUNTER — Telehealth: Payer: Self-pay | Admitting: *Deleted

## 2020-12-12 NOTE — Telephone Encounter (Signed)
[facility] called and stated medication will be here by 12/19/20

## 2020-12-12 NOTE — Telephone Encounter (Signed)
Noted  

## 2020-12-17 ENCOUNTER — Other Ambulatory Visit: Payer: Self-pay

## 2020-12-17 ENCOUNTER — Telehealth: Payer: Self-pay | Admitting: *Deleted

## 2020-12-17 DIAGNOSIS — B1920 Unspecified viral hepatitis C without hepatic coma: Secondary | ICD-10-CM

## 2020-12-17 NOTE — Telephone Encounter (Signed)
Pt's medication Epclusa was delivered today and was locked up.

## 2020-12-17 NOTE — Telephone Encounter (Signed)
Noted.  Please let patient know that her final shipment of Dorita Fray has arrived.  Please also remind her to complete blood work after she finishes her course of Epclusa.  She should call us with any questions or concerns.  We will plan to follow-up with her in February at scheduled.

## 2020-12-18 NOTE — Telephone Encounter (Signed)
Noted  

## 2020-12-18 NOTE — Telephone Encounter (Signed)
Spoke to pt informed her that Ebony Salinas is here for her to pick up. She informed me that she would be here before Monday. Told her to call office if she has any question about Epclusa and to please have blood work completed after she finishes the course.

## 2020-12-20 NOTE — Telephone Encounter (Signed)
Pt came by office to pick up Epclusa. Paperwork was signed and dated. Sending it to be scanned. Informed her to have labs done when she completes medications and to call office if she has any problems or concerns.

## 2020-12-20 NOTE — Telephone Encounter (Signed)
Noted  

## 2021-01-15 ENCOUNTER — Encounter: Payer: Self-pay | Admitting: Internal Medicine

## 2021-01-20 ENCOUNTER — Other Ambulatory Visit: Payer: Self-pay

## 2021-01-20 ENCOUNTER — Other Ambulatory Visit (HOSPITAL_COMMUNITY)
Admission: RE | Admit: 2021-01-20 | Discharge: 2021-01-20 | Disposition: A | Discharge status: Home / Self Care | Payer: BC Managed Care – PPO | Source: Ambulatory Visit | Attending: Gastroenterology | Admitting: Gastroenterology

## 2021-01-20 DIAGNOSIS — B1920 Unspecified viral hepatitis C without hepatic coma: Secondary | ICD-10-CM | POA: Insufficient documentation

## 2021-01-20 LAB — COMPREHENSIVE METABOLIC PANEL
ALT: 15 U/L (ref 0–44)
AST: 24 U/L (ref 15–41)
Albumin: 4.1 g/dL (ref 3.5–5.0)
Alkaline Phosphatase: 87 U/L (ref 38–126)
Anion gap: 5 (ref 5–15)
BUN: 16 mg/dL (ref 8–23)
CO2: 27 mmol/L (ref 22–32)
Calcium: 9 mg/dL (ref 8.9–10.3)
Chloride: 103 mmol/L (ref 98–111)
Creatinine, Ser: 0.76 mg/dL (ref 0.44–1.00)
GFR, Estimated: >60 mL/min (ref >60)
Glucose, Bld: 87 mg/dL (ref 70–99)
Potassium: 4 mmol/L (ref 3.5–5.1)
Sodium: 135 mmol/L (ref 135–145)
Total Bilirubin: 0.4 mg/dL (ref 0.3–1.2)
Total Protein: 7.6 g/dL (ref 6.5–8.1)

## 2021-01-21 LAB — HCV RNA QUANT: HCV Quantitative: NOT DETECTED IU/mL (ref >50)

## 2021-01-22 ENCOUNTER — Other Ambulatory Visit: Payer: Self-pay | Admitting: *Deleted

## 2021-01-22 DIAGNOSIS — B1920 Unspecified viral hepatitis C without hepatic coma: Secondary | ICD-10-CM

## 2021-02-11 DIAGNOSIS — J302 Other seasonal allergic rhinitis: Secondary | ICD-10-CM | POA: Diagnosis not present

## 2021-02-11 DIAGNOSIS — Z862 Personal history of diseases of the blood and blood-forming organs and certain disorders involving the immune mechanism: Secondary | ICD-10-CM | POA: Diagnosis not present

## 2021-02-11 DIAGNOSIS — M2550 Pain in unspecified joint: Secondary | ICD-10-CM | POA: Diagnosis not present

## 2021-02-11 DIAGNOSIS — Z681 Body mass index (BMI) 19 or less, adult: Secondary | ICD-10-CM | POA: Diagnosis not present

## 2021-02-11 DIAGNOSIS — F17200 Nicotine dependence, unspecified, uncomplicated: Secondary | ICD-10-CM | POA: Diagnosis not present

## 2021-02-28 ENCOUNTER — Ambulatory Visit: Payer: BC Managed Care – PPO | Admitting: Gastroenterology

## 2021-05-12 ENCOUNTER — Ambulatory Visit: Payer: BC Managed Care – PPO | Admitting: Gastroenterology

## 2021-05-28 DIAGNOSIS — L931 Subacute cutaneous lupus erythematosus: Secondary | ICD-10-CM | POA: Diagnosis not present

## 2021-08-22 DIAGNOSIS — Z5320 Procedure and treatment not carried out because of patient's decision for unspecified reasons: Secondary | ICD-10-CM | POA: Diagnosis not present

## 2021-08-22 DIAGNOSIS — Z131 Encounter for screening for diabetes mellitus: Secondary | ICD-10-CM | POA: Diagnosis not present

## 2021-08-22 DIAGNOSIS — Z7689 Persons encountering health services in other specified circumstances: Secondary | ICD-10-CM | POA: Diagnosis not present

## 2021-08-22 DIAGNOSIS — Z23 Encounter for immunization: Secondary | ICD-10-CM | POA: Diagnosis not present

## 2021-08-22 DIAGNOSIS — M329 Systemic lupus erythematosus, unspecified: Secondary | ICD-10-CM | POA: Diagnosis not present

## 2021-08-22 DIAGNOSIS — F17210 Nicotine dependence, cigarettes, uncomplicated: Secondary | ICD-10-CM | POA: Diagnosis not present

## 2021-08-22 DIAGNOSIS — S81801A Unspecified open wound, right lower leg, initial encounter: Secondary | ICD-10-CM | POA: Diagnosis not present

## 2022-10-17 IMAGING — US US ABDOMEN COMPLETE W/ ELASTOGRAPHY
1 series · 12 of 15 positions shown · non-contrast
Comparison: None.

CLINICAL DATA: Hepatitis C, question cirrhosis/fibrosis.

EXAM:
ULTRASOUND ABDOMEN
ULTRASOUND HEPATIC ELASTOGRAPHY
TECHNIQUE: Sonography of the upper abdomen was performed. In addition,
ultrasound elastography evaluation of the liver was performed. A
region of interest was placed within the right lobe of the liver.
Following application of a compressive sonographic pulse, tissue
compressibility was assessed. Multiple assessments were performed at
the selected site. Median tissue compressibility was determined.
Previously, hepatic stiffness was assessed by shear wave velocity.
Based on recently published Society of Radiologists in Ultrasound
consensus article, reporting is now recommended to be performed in
the SI units of pressure (kiloPascals) representing hepatic
stiffness/elasticity. The obtained result is compared to the
published reference standards. (cACLD = compensated Advanced Chronic
Liver Disease)

[Series 1: us abdomen complete w/elastography · 12 of 15 slices shown]
[im 1/15]
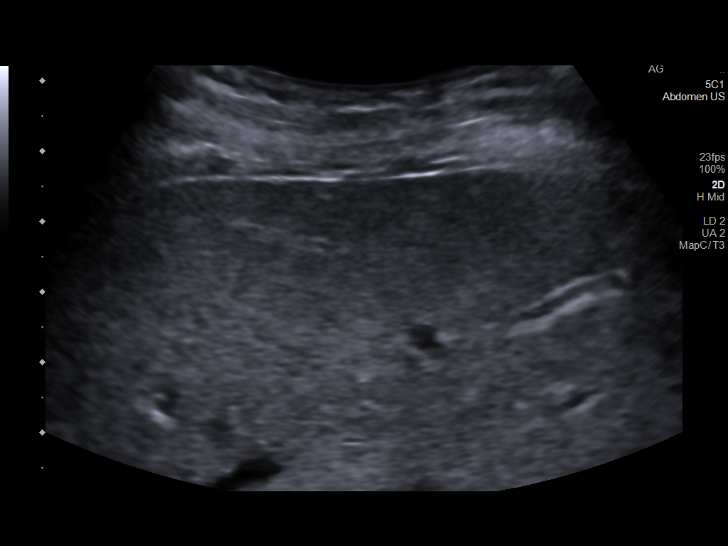
[im 2/15]
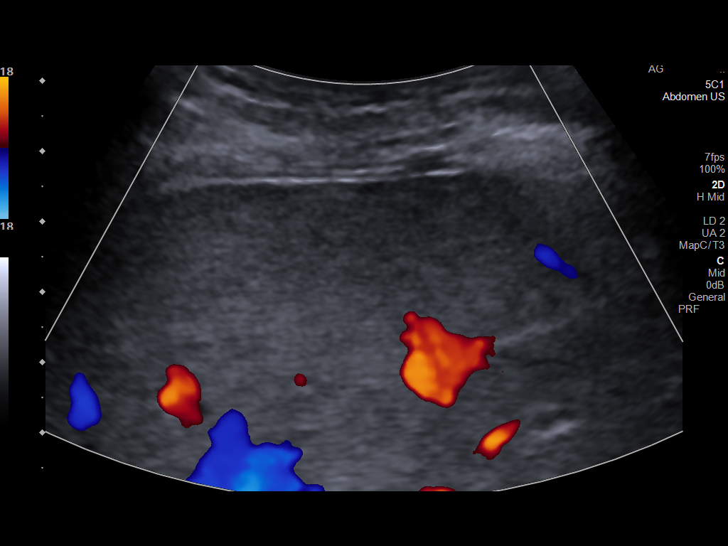
[im 4/15]
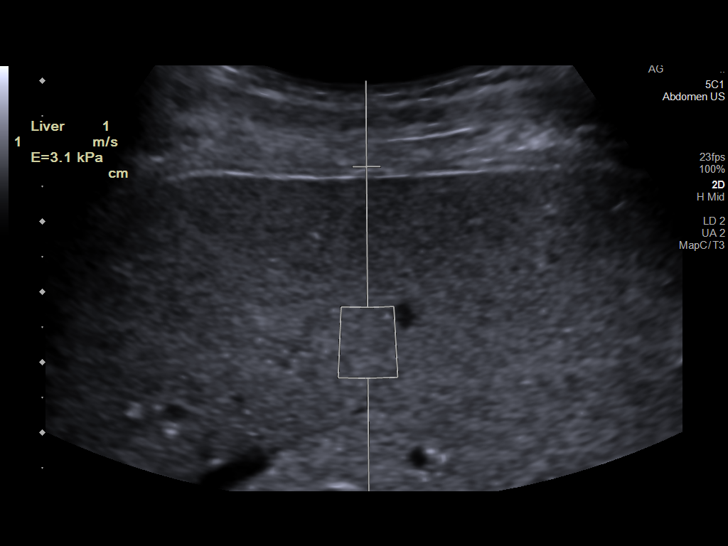
[im 5/15]
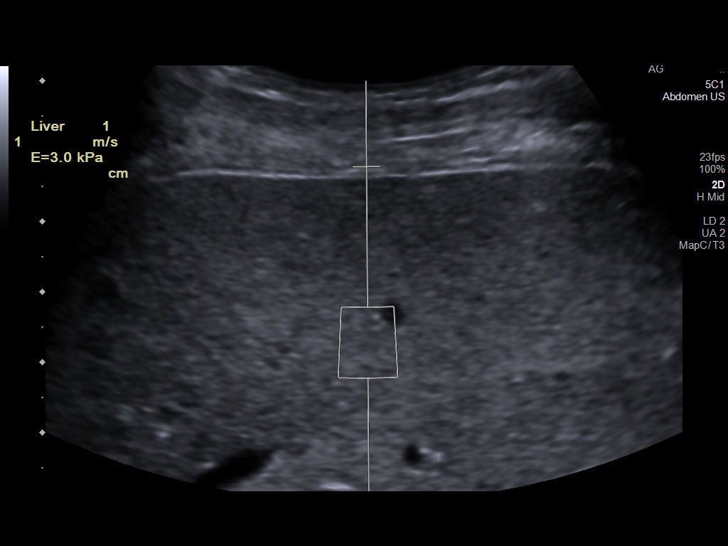
[im 6/15]
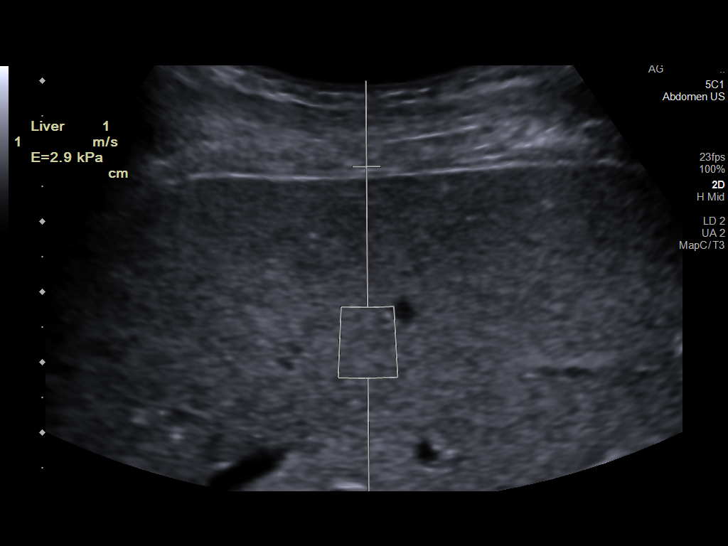
[im 7/15]
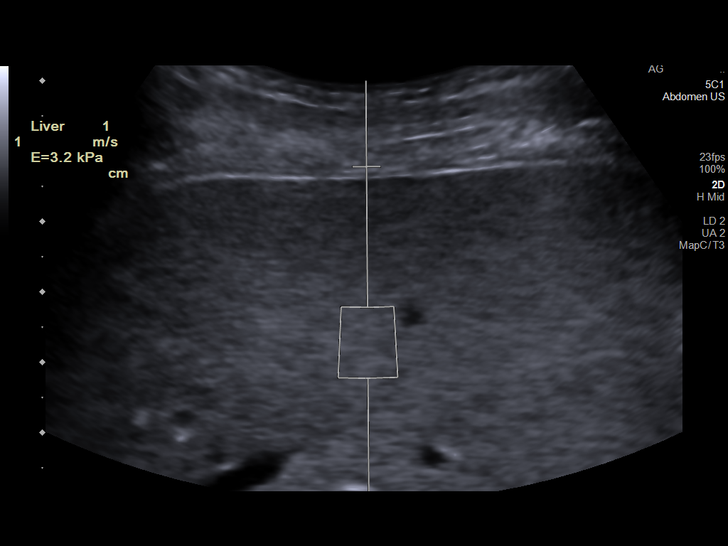
[im 9/15]
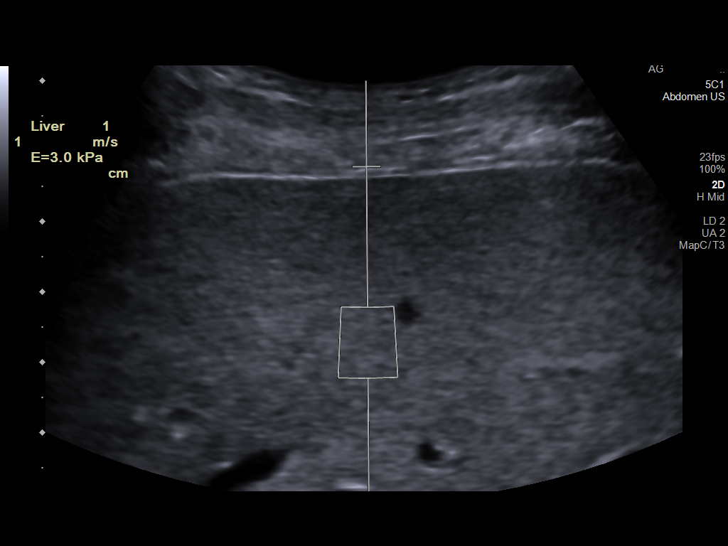
[im 10/15]
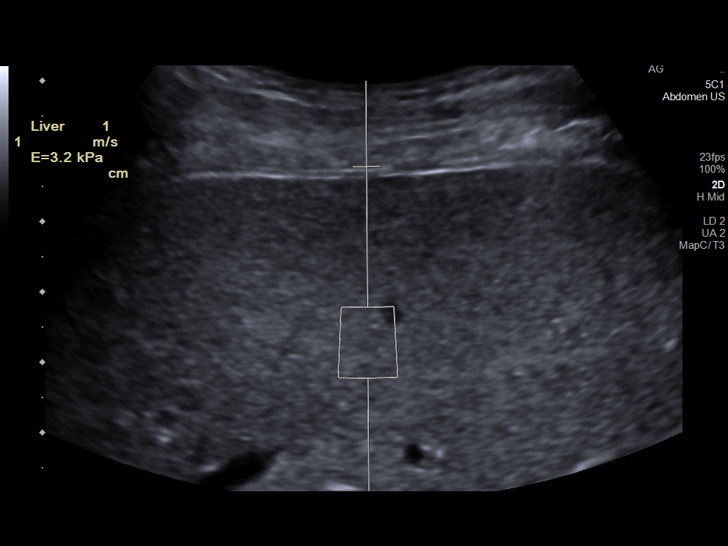
[im 11/15]
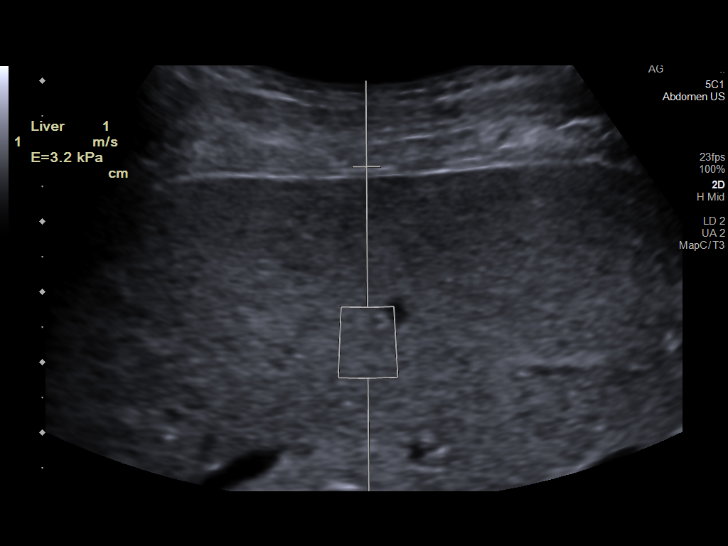
[im 12/15]
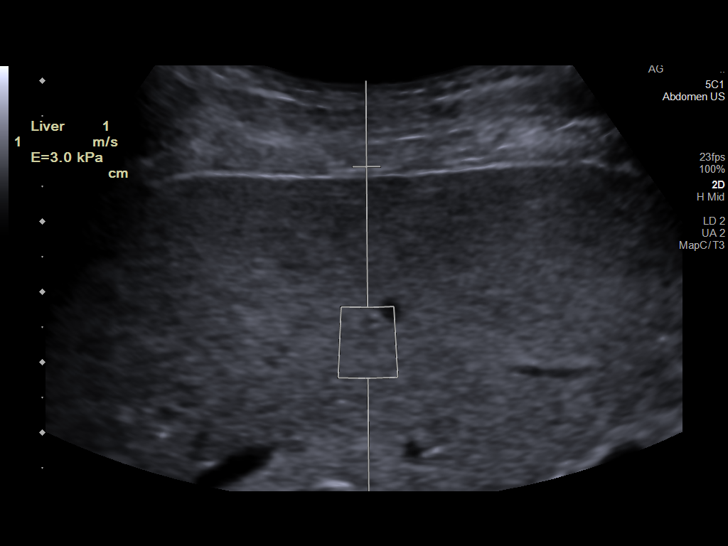
[im 14/15]
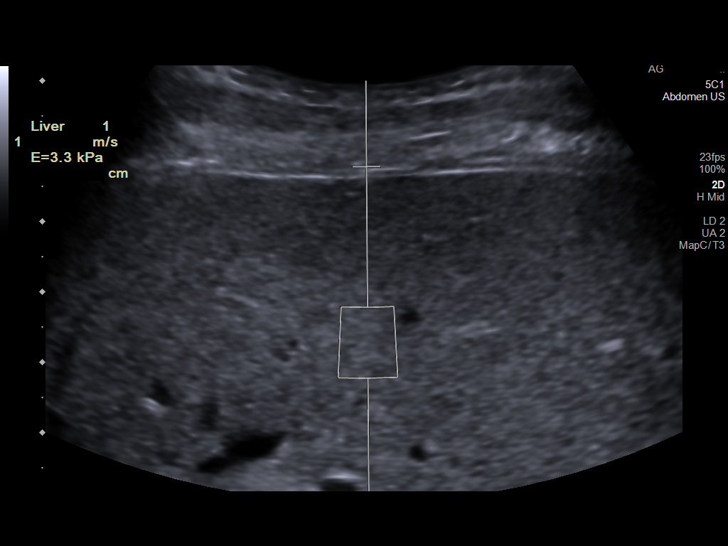
[im 15/15]
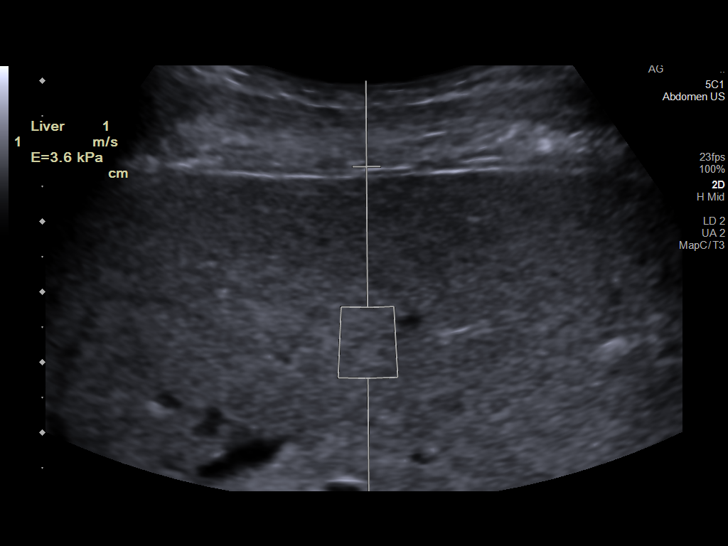

[12 of 15 positions shown; findings below may reference images not displayed]

FINDINGS: ULTRASOUND ABDOMEN

Gallbladder: No gallstones or wall thickening visualized. No
sonographic Murphy sign noted by sonographer.

Common bile duct: Diameter: Normal caliber, 2 mm

Liver: No focal lesion identified. Within normal limits in
parenchymal echogenicity. Portal vein is patent on color Doppler
imaging with normal direction of blood flow towards the liver.

IVC: No abnormality visualized.

Pancreas: Visualized portion unremarkable.

Spleen: Size and appearance within normal limits.

Right Kidney: Length: 11.1 cm. Echogenicity within normal limits. No
mass or hydronephrosis visualized.

Left Kidney: Length: 11.0 cm. Echogenicity within normal limits. No
mass or hydronephrosis visualized.

Abdominal aorta: No aneurysm visualized.

Other findings: None.

ULTRASOUND HEPATIC ELASTOGRAPHY

Device: Siemens Helix VTQ

Patient position: Oblique

Transducer 5C1

Number of measurements: 13

Hepatic segment:  8

Median kPa:

IQR:

IQR/Median kPa ratio:

Data quality:  Good

Diagnostic category:  < or = 5 kPa: high probability of being normal

The use of hepatic elastography is applicable to patients with viral
hepatitis and non-alcoholic fatty liver disease. At this time, there
is insufficient data for the referenced cut-off values and use in
other causes of liver disease, including alcoholic liver disease.
Patients, however, may be assessed by elastography and serve as
their own reference standard/baseline.

In patients with non-alcoholic liver disease, the values suggesting
compensated advanced chronic liver disease (cACLD) may be lower, and
patients may need additional testing with elasticity results of [DATE]
kPa.

Please note that abnormal hepatic elasticity and shear wave
velocities may also be identified in clinical settings other than
with hepatic fibrosis, such as: acute hepatitis, elevated right
heart and central venous pressures including use of beta blockers,
Bhebhe disease ([place]), infiltrative processes such as
mastocytosis/amyloidosis/infiltrative tumor/lymphoma, extrahepatic
cholestasis, with hyperemia in the post-prandial state, and with
liver transplantation. Correlation with patient history, laboratory
data, and clinical condition recommended.

Diagnostic Categories:

< or =5 kPa: high probability of being normal

< or =9 kPa: in the absence of other known clinical signs, rules [DATE] kPa and ?13 kPa: suggestive of cACLD, but needs further testing

>13 kPa: highly suggestive of cACLD

> or =17 kPa: highly suggestive of cACLD with an increased
probability of clinically significant portal hypertension
IMPRESSION: ULTRASOUND ABDOMEN:

No acute findings.

ULTRASOUND HEPATIC ELASTOGRAPHY:

Median kPa:

Diagnostic category:  < or = 5 kPa: high probability of being normal
